# Patient Record
Sex: Female | Born: 1995 | Race: Black or African American | Hispanic: No | Marital: Single | Smoking: Never smoker
Health system: Southern US, Community
[De-identification: ages and names within clinical notes are randomized; demographics above are authoritative.]

## PROBLEM LIST (undated history)

## (undated) DIAGNOSIS — M419 Scoliosis, unspecified: Secondary | ICD-10-CM

## (undated) DIAGNOSIS — J45909 Unspecified asthma, uncomplicated: Secondary | ICD-10-CM

---
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
## 2017-09-25 IMAGING — CR DG SCOLIOSIS EVAL COMPLETE SPINE 1V
1 series · 3 of 3 positions shown · non-contrast
Comparison: Scoliosis survey 09/18/2013 and 09/28/2014.

CLINICAL DATA: History of scoliosis. Mid back pain intermittently
for few weeks.

EXAM:
DG SCOLIOSIS EVAL COMPLETE SPINE 1V

[Series 1001: view not recorded · 0.40mm/px · 3 of 3 slices shown]
[im 1/3]
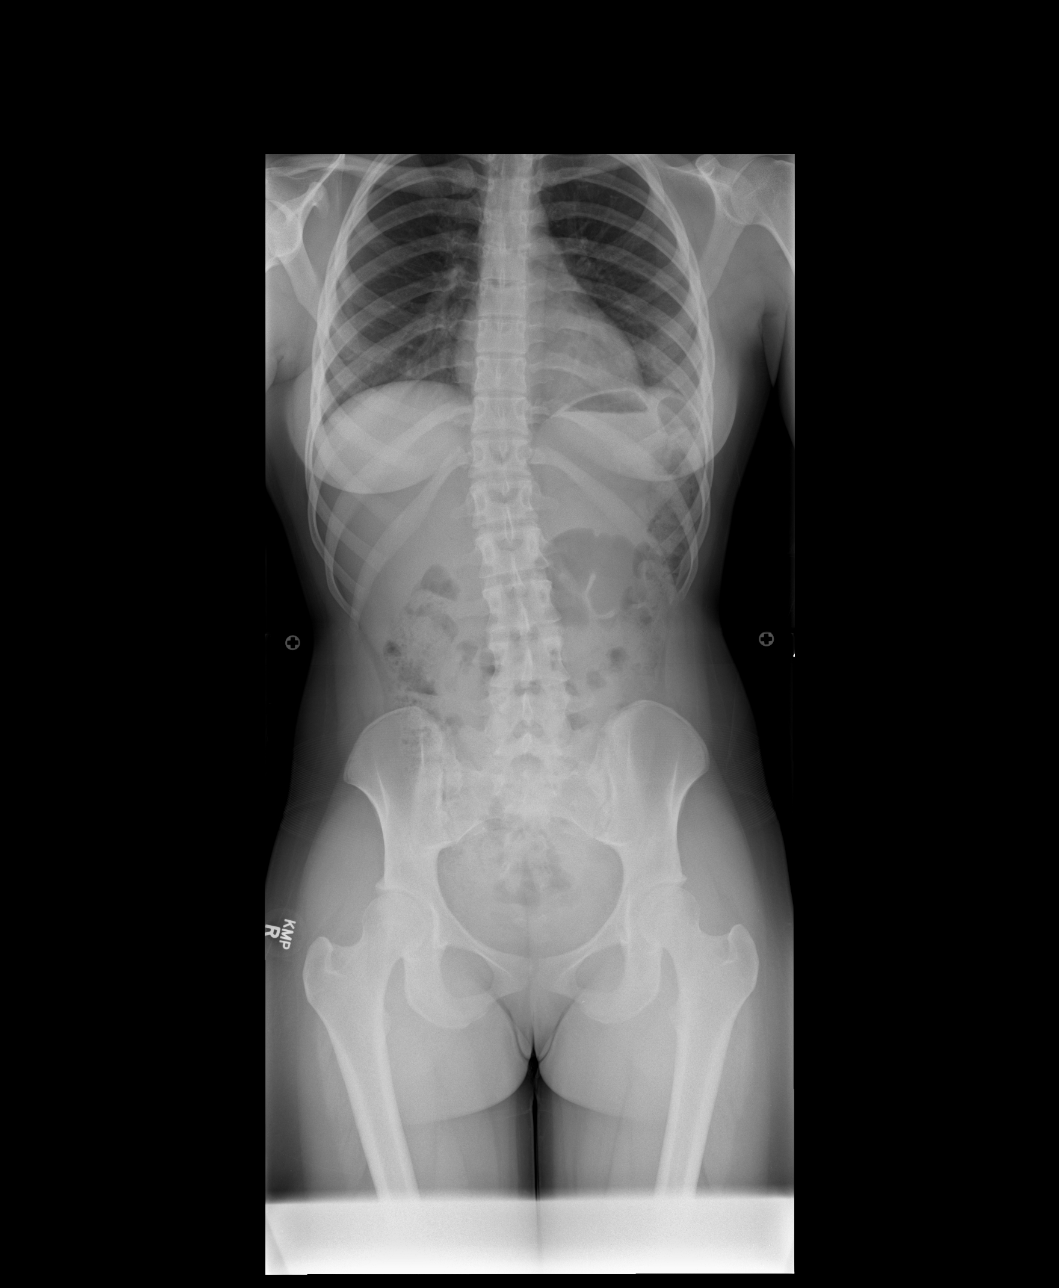
[im 2/3]
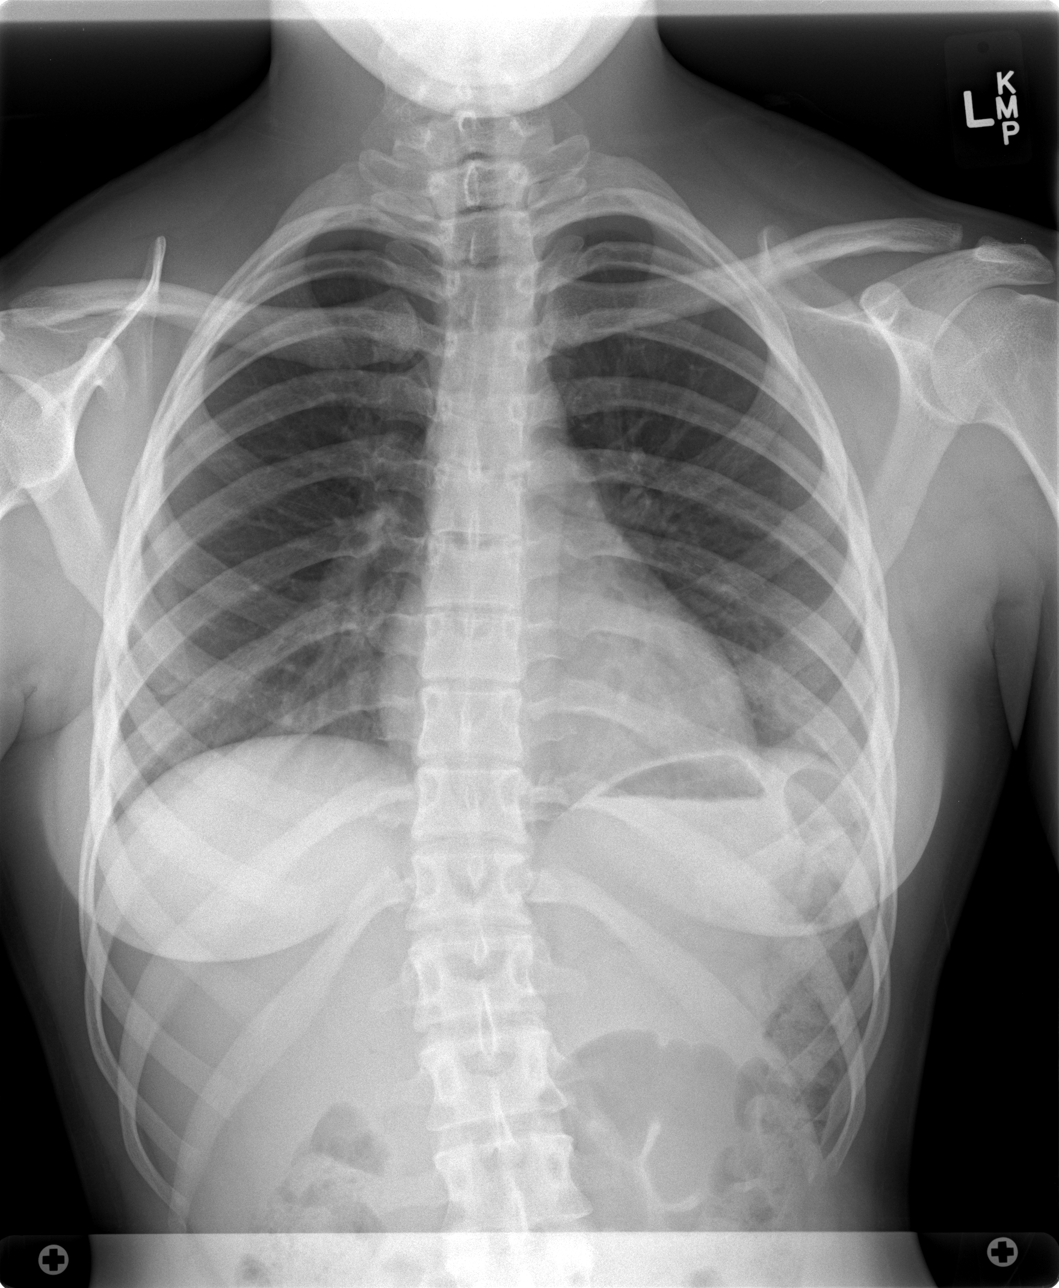
[im 3/3]
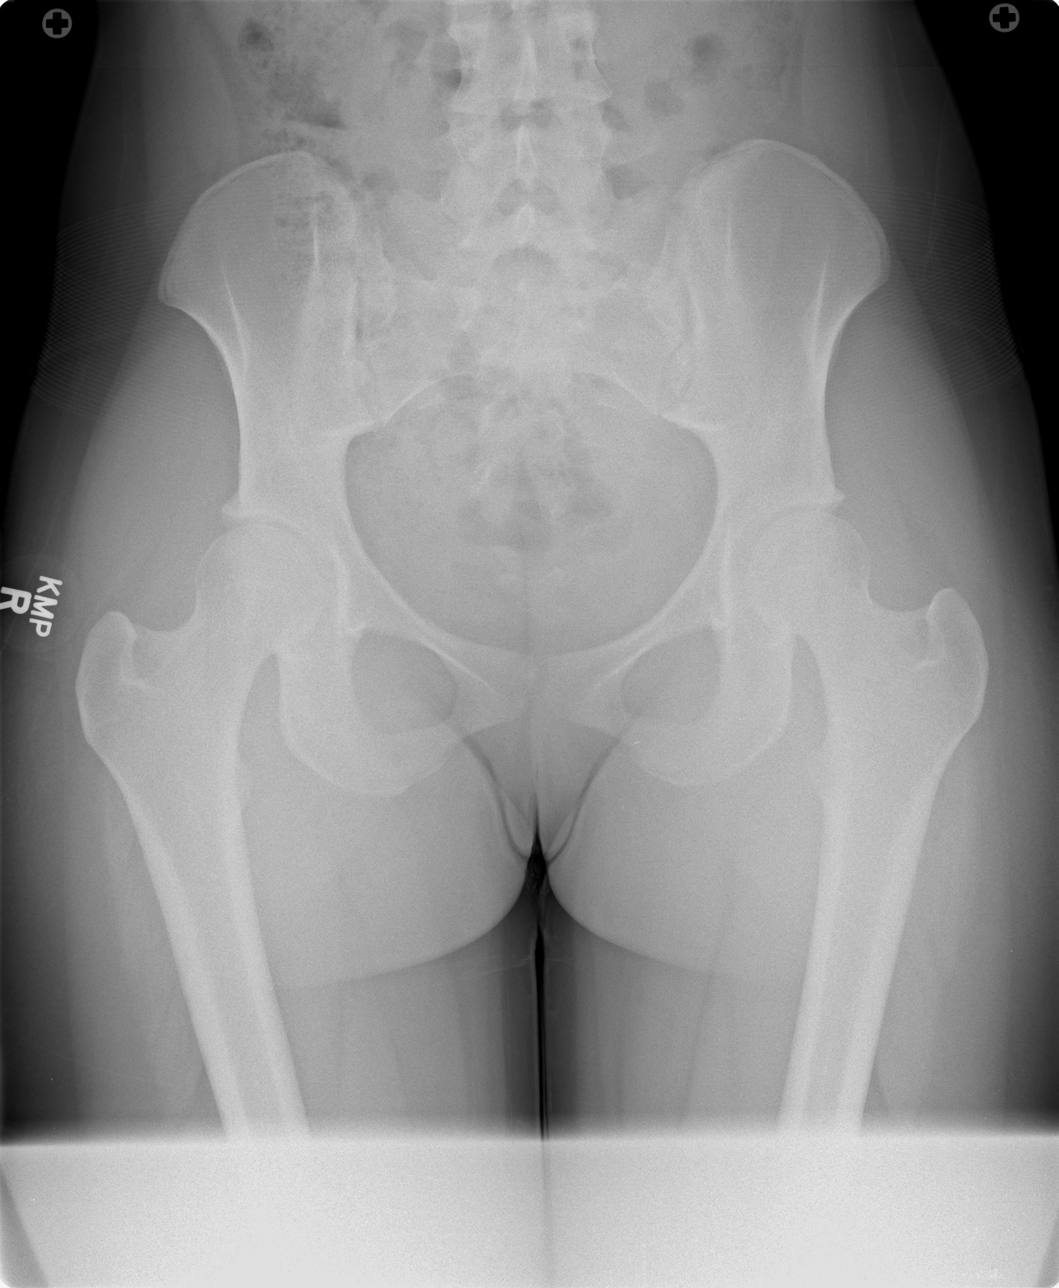

[3 of 3 positions shown; findings below may reference images not displayed]

FINDINGS: Thoracolumbar scoliosis again seen. There is 7 degrees convex right
curvature of the thoracic spine with the apex at approximately T12
and 5 degrees convex left curvature of the lumbar spine with the
apex at approximately at L4-5. The appearance is not markedly
changed compared to the 1777 exam. No segmentation anomaly is
identified. The [REDACTED] rib-bearing vertebral bodies and 5 lumbar
type vertebral bodies.
IMPRESSION: No marked change in mild thoracolumbar scoliosis since 1777. No
acute abnormality.

11 rib-bearing vertebral bodies and 5 lumbar type vertebral bodies
as on prior examinations.

## 2022-05-23 ENCOUNTER — Ambulatory Visit (INDEPENDENT_AMBULATORY_CARE_PROVIDER_SITE_OTHER): Payer: 59 | Admitting: Family Medicine

## 2022-05-23 ENCOUNTER — Encounter: Payer: Self-pay | Admitting: Family Medicine

## 2022-05-23 VITALS — BP 122/76 | HR 87 | Temp 99.2°F | Ht 64.5 in | Wt 151.6 lb

## 2022-05-23 DIAGNOSIS — L308 Other specified dermatitis: Secondary | ICD-10-CM

## 2022-05-23 DIAGNOSIS — J4540 Moderate persistent asthma, uncomplicated: Secondary | ICD-10-CM

## 2022-05-23 DIAGNOSIS — Z23 Encounter for immunization: Secondary | ICD-10-CM

## 2022-05-23 DIAGNOSIS — T300 Burn of unspecified body region, unspecified degree: Secondary | ICD-10-CM | POA: Diagnosis not present

## 2022-05-23 DIAGNOSIS — J302 Other seasonal allergic rhinitis: Secondary | ICD-10-CM | POA: Diagnosis not present
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
## 2022-05-30 ENCOUNTER — Ambulatory Visit: Payer: 59 | Admitting: Family Medicine

## 2022-06-03 ENCOUNTER — Encounter: Payer: Self-pay | Admitting: Family Medicine

## 2022-06-11 ENCOUNTER — Encounter: Payer: 59 | Admitting: Family Medicine

## 2022-06-25 ENCOUNTER — Ambulatory Visit (INDEPENDENT_AMBULATORY_CARE_PROVIDER_SITE_OTHER): Payer: 59 | Admitting: Family Medicine

## 2022-06-25 ENCOUNTER — Encounter: Payer: Self-pay | Admitting: Family Medicine

## 2022-06-25 VITALS — BP 108/68 | HR 98 | Temp 98.8°F | Ht 65.5 in | Wt 153.8 lb

## 2022-06-25 DIAGNOSIS — Z113 Encounter for screening for infections with a predominantly sexual mode of transmission: Secondary | ICD-10-CM

## 2022-06-25 DIAGNOSIS — Z0000 Encounter for general adult medical examination without abnormal findings: Secondary | ICD-10-CM | POA: Diagnosis not present

## 2022-06-25 DIAGNOSIS — Z1159 Encounter for screening for other viral diseases: Secondary | ICD-10-CM

## 2022-06-25 DIAGNOSIS — E559 Vitamin D deficiency, unspecified: Secondary | ICD-10-CM

## 2022-06-25 LAB — LIPID PANEL
Cholesterol: 185 mg/dL (ref 0–200)
HDL: 58.1 mg/dL (ref >39.00)
LDL Cholesterol: 115 mg/dL — ABNORMAL HIGH (ref 0–99)
NonHDL: 126.76
Total CHOL/HDL Ratio: 3
Triglycerides: 59 mg/dL (ref 0.0–149.0)
VLDL: 11.8 mg/dL (ref 0.0–40.0)

## 2022-06-25 LAB — BASIC METABOLIC PANEL
BUN: 8 mg/dL (ref 6–23)
CO2: 25 mEq/L (ref 19–32)
Calcium: 9 mg/dL (ref 8.4–10.5)
Chloride: 106 mEq/L (ref 96–112)
Creatinine, Ser: 0.7 mg/dL (ref 0.40–1.20)
GFR: 120.4 mL/min (ref >60.00)
Glucose, Bld: 87 mg/dL (ref 70–99)
Potassium: 3.9 mEq/L (ref 3.5–5.1)
Sodium: 137 mEq/L (ref 135–145)

## 2022-06-25 LAB — CBC WITH DIFFERENTIAL/PLATELET
Basophils Absolute: 0 10*3/uL (ref 0.0–0.1)
Basophils Relative: 0.7 % (ref 0.0–3.0)
Eosinophils Absolute: 0.3 10*3/uL (ref 0.0–0.7)
Eosinophils Relative: 4.5 % (ref 0.0–5.0)
HCT: 39.1 % (ref 36.0–46.0)
Hemoglobin: 12.9 g/dL (ref 12.0–15.0)
Lymphocytes Relative: 33.2 % (ref 12.0–46.0)
Lymphs Abs: 2.3 10*3/uL (ref 0.7–4.0)
MCHC: 33 g/dL (ref 30.0–36.0)
MCV: 84.4 fl (ref 78.0–100.0)
Monocytes Absolute: 0.7 10*3/uL (ref 0.1–1.0)
Monocytes Relative: 10 % (ref 3.0–12.0)
Neutro Abs: 3.6 10*3/uL (ref 1.4–7.7)
Neutrophils Relative %: 51.6 % (ref 43.0–77.0)
Platelets: 403 10*3/uL — ABNORMAL HIGH (ref 150.0–400.0)
RBC: 4.64 Mil/uL (ref 3.87–5.11)
RDW: 13.6 % (ref 11.5–15.5)
WBC: 6.9 10*3/uL (ref 4.0–10.5)

## 2022-06-25 LAB — VITAMIN D 25 HYDROXY (VIT D DEFICIENCY, FRACTURES): VITD: 43.69 ng/mL (ref 30.00–100.00)

## 2022-06-25 LAB — HEMOGLOBIN A1C: Hgb A1c MFr Bld: 6 % (ref 4.6–6.5)

## 2022-06-25 NOTE — Patient Instructions (Signed)
As needed
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
## 2022-06-25 NOTE — Addendum Note (Signed)
Addended by: Marian Sorrow D on: 06/25/2022 10:17 AM   Modules accepted: Orders

## 2022-06-26 LAB — C. TRACHOMATIS/N. GONORRHOEAE RNA
C. trachomatis RNA, TMA: NOT DETECTED
N. gonorrhoeae RNA, TMA: NOT DETECTED

## 2022-09-05 ENCOUNTER — Telehealth: Payer: Self-pay | Admitting: Family Medicine

## 2022-09-05 NOTE — Telephone Encounter (Signed)
Pt needs a refill on Flovent sent to her pharmacy.  There was a mix up at the pharmacy and now the patient is out.    Pharmacy- [facility]

## 2022-09-05 NOTE — Telephone Encounter (Signed)
Pt stated that Dr. Volanda Napoleon is her new provider and would like for her to take over filling the flovent. [place] for refill?

## 2022-09-06 ENCOUNTER — Telehealth: Payer: Self-pay | Admitting: Family Medicine

## 2022-09-06 MED ORDER — FLUTICASONE PROPIONATE HFA 110 MCG/ACT IN AERO: 2.0000 | INHALATION_SPRAY | Freq: Two times a day (BID) | RESPIRATORY_TRACT | 11 refills | Status: DC

## 2022-09-06 NOTE — Telephone Encounter (Signed)
Rx refilled.

## 2022-09-06 NOTE — Telephone Encounter (Signed)
Ok to refill flovent with 11 refills.

## 2022-09-18 ENCOUNTER — Other Ambulatory Visit: Payer: Self-pay | Admitting: Family Medicine

## 2022-09-21 NOTE — Telephone Encounter (Signed)
Patient calling states the Qvar is covered under her insurance and please write for this.

## 2022-09-26 NOTE — Telephone Encounter (Signed)
Pt said qvar is covered

## 2022-09-28 ENCOUNTER — Other Ambulatory Visit: Payer: Self-pay | Admitting: Family Medicine

## 2022-09-28 MED ORDER — QVAR REDIHALER 40 MCG/ACT IN AERB: 1.0000 | INHALATION_SPRAY | Freq: Two times a day (BID) | RESPIRATORY_TRACT | 11 refills | Status: DC

## 2022-09-28 NOTE — Telephone Encounter (Signed)
Qvar sent to pharmacy 

## 2022-12-04 ENCOUNTER — Telehealth: Payer: Self-pay | Admitting: Family Medicine

## 2022-12-04 ENCOUNTER — Other Ambulatory Visit: Payer: Self-pay | Admitting: Family Medicine

## 2022-12-04 NOTE — Telephone Encounter (Signed)
Prescription Request  12/04/2022  LOV: 06/25/2022  What is the name of the medication or equipment? montelukast (SINGULAIR) 10 MG tablet . This med was prescribed by her old pcp  Have you contacted your pharmacy to request a refill? No   Which pharmacy would you like this sent to?  [id number] - [place], [place] - [address] AT [facility] [address] [place] [address] Phone: [phone] Fax: [phone]    Patient notified that their request is being sent to the clinical staff for review and that they should receive a response within 2 business days.   Please advise at Mobile [phone] (mobile)

## 2022-12-05 NOTE — Telephone Encounter (Signed)
Med refilled.

## 2023-03-07 ENCOUNTER — Encounter (INDEPENDENT_AMBULATORY_CARE_PROVIDER_SITE_OTHER): Payer: Self-pay

## 2023-03-27 ENCOUNTER — Encounter: Payer: Self-pay | Admitting: Family Medicine

## 2023-03-27 ENCOUNTER — Ambulatory Visit (INDEPENDENT_AMBULATORY_CARE_PROVIDER_SITE_OTHER): Payer: 59 | Admitting: Family Medicine

## 2023-03-27 VITALS — BP 98/68 | HR 78 | Temp 98.7°F | Ht 65.5 in | Wt 151.0 lb

## 2023-03-27 DIAGNOSIS — Z23 Encounter for immunization: Secondary | ICD-10-CM

## 2023-03-27 DIAGNOSIS — Z0000 Encounter for general adult medical examination without abnormal findings: Secondary | ICD-10-CM

## 2023-03-27 DIAGNOSIS — Z1159 Encounter for screening for other viral diseases: Secondary | ICD-10-CM | POA: Diagnosis not present

## 2023-03-27 DIAGNOSIS — E7841 Elevated Lipoprotein(a): Secondary | ICD-10-CM | POA: Diagnosis not present

## 2023-03-27 DIAGNOSIS — R7303 Prediabetes: Secondary | ICD-10-CM

## 2023-03-27 DIAGNOSIS — J302 Other seasonal allergic rhinitis: Secondary | ICD-10-CM

## 2023-03-27 LAB — COMPREHENSIVE METABOLIC PANEL
ALT: 14 U/L (ref 0–35)
AST: 31 U/L (ref 0–37)
Albumin: 3.7 g/dL (ref 3.5–5.2)
Alkaline Phosphatase: 40 U/L (ref 39–117)
BUN: 9 mg/dL (ref 6–23)
CO2: 27 meq/L (ref 19–32)
Calcium: 9.3 mg/dL (ref 8.4–10.5)
Chloride: 105 meq/L (ref 96–112)
Creatinine, Ser: 0.72 mg/dL (ref 0.40–1.20)
GFR: 115.78 mL/min (ref >60.00)
Glucose, Bld: 88 mg/dL (ref 70–99)
Potassium: 4.3 meq/L (ref 3.5–5.1)
Sodium: 138 meq/L (ref 135–145)
Total Bilirubin: 0.3 mg/dL (ref 0.2–1.2)
Total Protein: 6.8 g/dL (ref 6.0–8.3)

## 2023-03-27 LAB — CBC WITH DIFFERENTIAL/PLATELET
Basophils Absolute: 0.1 10*3/uL (ref 0.0–0.1)
Basophils Relative: 0.9 % (ref 0.0–3.0)
Eosinophils Absolute: 0.5 10*3/uL (ref 0.0–0.7)
Eosinophils Relative: 5.4 % — ABNORMAL HIGH (ref 0.0–5.0)
HCT: 37.4 % (ref 36.0–46.0)
Hemoglobin: 11.9 g/dL — ABNORMAL LOW (ref 12.0–15.0)
Lymphocytes Relative: 29.3 % (ref 12.0–46.0)
Lymphs Abs: 2.5 10*3/uL (ref 0.7–4.0)
MCHC: 31.8 g/dL (ref 30.0–36.0)
MCV: 84.9 fl (ref 78.0–100.0)
Monocytes Absolute: 0.6 10*3/uL (ref 0.1–1.0)
Monocytes Relative: 7.2 % (ref 3.0–12.0)
Neutro Abs: 4.9 10*3/uL (ref 1.4–7.7)
Neutrophils Relative %: 57.2 % (ref 43.0–77.0)
Platelets: 462 10*3/uL — ABNORMAL HIGH (ref 150.0–400.0)
RBC: 4.41 Mil/uL (ref 3.87–5.11)
RDW: 13.8 % (ref 11.5–15.5)
WBC: 8.5 10*3/uL (ref 4.0–10.5)

## 2023-03-27 LAB — LIPID PANEL
Cholesterol: 167 mg/dL (ref 0–200)
HDL: 52.5 mg/dL (ref >39.00)
LDL Cholesterol: 95 mg/dL (ref 0–99)
NonHDL: 114.75
Total CHOL/HDL Ratio: 3
Triglycerides: 98 mg/dL (ref 0.0–149.0)
VLDL: 19.6 mg/dL (ref 0.0–40.0)

## 2023-03-27 LAB — TSH: TSH: 1.32 u[IU]/mL (ref 0.35–5.50)

## 2023-03-27 LAB — HEMOGLOBIN A1C: Hgb A1c MFr Bld: 5.9 % (ref 4.6–6.5)

## 2023-03-27 MED ORDER — AZELASTINE HCL 137 MCG/SPRAY NA SOLN
1.0000 | Freq: Every day | NASAL | 5 refills | Status: DC | PRN
Start: 2023-03-27 — End: 2023-12-12

## 2023-03-27 NOTE — Progress Notes (Addendum)
Established Patient Office Visit   Subjective  Patient ID: Trina Ao, female    DOB: 1996-07-04  Age: 27 y.o. MRN: [id number]  Chief Complaint  Patient presents with   Annual Exam    Pt is a 27 yo female seen for CPE.  Pt doing well.  Staying busy with school and work.  Patient states she should be finished with school in December.  Patient wanted to get a physical before her parents insurance runs out when she turns 27.  Patient had Tdap on 01/30/2016 at pediatrician office prior to shadowing.  Pap done 07/2022.  Requesting refill on azelastine.  Patient notes history of eczema.  States in the past went on vacation and developed blisters on hands.  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  Patient inquires if there is anything she can do to prevent this.    Past Medical History:  Diagnosis Date   Asthma    Scoliosis    History reviewed. No pertinent surgical history. Social History   Tobacco Use   Smoking status: Never   History reviewed. No pertinent family history. Allergies  Allergen Reactions   Oseltamivir Rash and Other (See Comments)      ROS Negative unless stated above    Objective:     BP 98/68 (BP Location: Left Arm, Patient Position: Sitting, Cuff Size: Normal)   Pulse 78   Temp 98.7 F (37.1 C) (Oral)   Ht 5' 5.5" (1.664 m)   Wt 151 lb (68.5 kg)   LMP 03/07/2023 (Exact Date)   SpO2 99%   BMI 24.75 kg/m  BP Readings from Last 3 Encounters:  03/27/23 98/68  06/25/22 108/68  05/23/22 122/76   Wt Readings from Last 3 Encounters:  03/27/23 151 lb (68.5 kg)  06/25/22 153 lb 12.8 oz (69.8 kg)  05/23/22 151 lb 9.6 oz (68.8 kg)      Physical Exam Constitutional:      Appearance: Normal appearance.  HENT:     Head: Normocephalic and atraumatic.     Right Ear: Tympanic membrane, ear canal and external ear normal.     Left Ear: Tympanic membrane, ear canal and external ear normal.     Nose: Nose normal.     Mouth/Throat:     Mouth:  Mucous membranes are moist.     Pharynx: No oropharyngeal exudate or posterior oropharyngeal erythema.  Eyes:     General: No scleral icterus.    Extraocular Movements: Extraocular movements intact.     Conjunctiva/sclera: Conjunctivae normal.     Pupils: Pupils are equal, round, and reactive to light.  Neck:     Thyroid: No thyromegaly.  Cardiovascular:     Rate and Rhythm: Normal rate and regular rhythm.     Pulses: Normal pulses.     Heart sounds: Normal heart sounds. No murmur heard.    No friction rub.  Pulmonary:     Effort: Pulmonary effort is normal.     Breath sounds: Normal breath sounds. No wheezing, rhonchi or rales.  Abdominal:     General: Bowel sounds are normal.     Palpations: Abdomen is soft.     Tenderness: There is no abdominal tenderness.  Musculoskeletal:        General: No deformity. Normal range of motion.  Lymphadenopathy:     Cervical: No cervical adenopathy.  Skin:    General: Skin is warm and dry.     Findings: No lesion.     Comments:  Dry appearing patches of skin on right lateral neck.  Neurological:     General: No focal deficit present.     Mental Status: She is alert and oriented to person, place, and time.  Psychiatric:        Mood and Affect: Mood normal.        Thought Content: Thought content normal.      No results found for any visits on 03/27/23.    Assessment & Plan:  Well adult exam -     CBC with Differential/Platelet; Future -     Comprehensive metabolic panel; Future -     Lipid panel; Future -     TSH; Future -     Hemoglobin A1c; Future  Need for influenza vaccination -     Flu vaccine trivalent PF, 6mos and older(Flulaval,Afluria,Fluarix,Fluzone)  Prediabetes -Hemoglobin A1c 6.0% on 06/25/2022 -Lifestyle modifications -     Hemoglobin A1c; Future  Elevated lipoprotein(a) -Total cholesterol 185, HDL 58.1, LDL 115, triglycerides 59 on 06/25/2022. -Lifestyle modifications -     Comprehensive metabolic panel;  Future -     Lipid panel; Future  Seasonal allergies -Azelastine nasal spray  Age-appropriate health screenings discussed.  Labs obtained.  Pap up-to-date as done 07/2022.  Immunizations reviewed.  Influenza vaccine given this visit.  Next CPE in 1 year.  Return if symptoms worsen or fail to improve.   Deeann Saint, MD

## 2023-03-27 NOTE — Addendum Note (Signed)
Addended by: Abbe Amsterdam R on: 03/27/2023 09:16 AM   Modules accepted: Orders

## 2023-03-28 LAB — HEPATITIS C ANTIBODY: Hepatitis C Ab: NONREACTIVE

## 2023-06-27 ENCOUNTER — Encounter: Payer: 59 | Admitting: Family Medicine

## 2023-09-03 LAB — HM PAP SMEAR: Chlamydia, Swab/Urine, PCR: POSITIVE

## 2023-10-02 ENCOUNTER — Other Ambulatory Visit: Payer: Self-pay | Admitting: Family Medicine

## 2023-10-02 NOTE — Telephone Encounter (Signed)
 Copied from CRM [id number]. Topic: Clinical - Medication Refill >> Oct 02, 2023  1:02 PM [place] J wrote: Most Recent Primary Care Visit:  Provider: Abbe Amsterdam R  Department: [facility]  Visit Type: PHYSICAL  Date: 03/27/2023  Medication: triamcinolone ointment (KENALOG) 0.1 %  Has the patient contacted their pharmacy? Yes (Agent: If no, request that the patient contact the pharmacy for the refill. If patient does not wish to contact the pharmacy document the reason why and proceed with request.) (Agent: If yes, when and what did the pharmacy advise?) Pharmacy stated that medication was denied.  Is this the correct pharmacy for this prescription? Yes If no, delete pharmacy and type the correct one.  This is the patient's preferred pharmacy:  [id number] - [place], [place] - [address] AT [facility] [address] [place] [address] Phone: [phone] Fax: [phone]   Has the prescription been filled recently? No  Is the patient out of the medication? Yes  Has the patient been seen for an appointment in the last year OR does the patient have an upcoming appointment? Yes  Can we respond through MyChart? Yes  Agent: Please be advised that Rx refills may take up to 3 business days. We ask that you follow-up with your pharmacy.

## 2023-11-21 LAB — MOLECULAR PATHOLOGY: Chlamydia, Swab/Urine, PCR: NEGATIVE

## 2023-12-11 ENCOUNTER — Other Ambulatory Visit: Payer: Self-pay | Admitting: Family Medicine

## 2023-12-11 DIAGNOSIS — J302 Other seasonal allergic rhinitis: Secondary | ICD-10-CM

## 2024-03-03 ENCOUNTER — Telehealth: Payer: Self-pay

## 2024-03-03 NOTE — Telephone Encounter (Signed)
 [facility] They state that patient's insurance will cover Asmanex HFA or Pulmicort Flexhaler in place of QVAR.

## 2024-03-03 NOTE — Telephone Encounter (Signed)
 Copied from CRM [id number]. Topic: Clinical - Prescription Issue >> Mar 02, 2024  1:27 PM Franky GRADE wrote: Reason for CRM: Patient was informed by her pharmacy that her insurance does not cover QVAR  REDIHALER 40 MCG/ACT inhaler [[id number]], and she would like to know if there is an alternative inhaler that can be prescribed.

## 2024-03-05 ENCOUNTER — Other Ambulatory Visit: Payer: Self-pay | Admitting: Family Medicine

## 2024-03-05 DIAGNOSIS — J4530 Mild persistent asthma, uncomplicated: Secondary | ICD-10-CM

## 2024-03-05 MED ORDER — ASMANEX HFA 100 MCG/ACT IN AERO
2.0000 | INHALATION_SPRAY | Freq: Two times a day (BID) | RESPIRATORY_TRACT | 5 refills | Status: DC
Start: 2024-03-05 — End: 2024-03-12

## 2024-03-05 NOTE — Telephone Encounter (Signed)
 Asmanex  sent to pharmacy.

## 2024-03-09 ENCOUNTER — Other Ambulatory Visit: Payer: Self-pay | Admitting: Family Medicine

## 2024-03-09 DIAGNOSIS — J4530 Mild persistent asthma, uncomplicated: Secondary | ICD-10-CM

## 2024-03-12 MED ORDER — BUDESONIDE 90 MCG/ACT IN AEPB: 2.0000 | INHALATION_SPRAY | Freq: Two times a day (BID) | RESPIRATORY_TRACT | 5 refills | Status: AC

## 2024-03-12 NOTE — Addendum Note (Signed)
 Addended by: MERCER CLOTILDA SAUNDERS on: 03/12/2024 09:37 AM   Modules accepted: Orders

## 2024-03-27 ENCOUNTER — Ambulatory Visit (INDEPENDENT_AMBULATORY_CARE_PROVIDER_SITE_OTHER): Payer: 59 | Admitting: Family Medicine

## 2024-03-27 ENCOUNTER — Encounter: Payer: Self-pay | Admitting: Family Medicine

## 2024-03-27 VITALS — BP 110/80 | HR 81 | Temp 98.8°F | Ht 65.75 in | Wt 153.2 lb

## 2024-03-27 DIAGNOSIS — Z0000 Encounter for general adult medical examination without abnormal findings: Secondary | ICD-10-CM

## 2024-03-27 DIAGNOSIS — J4540 Moderate persistent asthma, uncomplicated: Secondary | ICD-10-CM

## 2024-03-27 DIAGNOSIS — Z23 Encounter for immunization: Secondary | ICD-10-CM

## 2024-03-27 DIAGNOSIS — L309 Dermatitis, unspecified: Secondary | ICD-10-CM

## 2024-03-27 MED ORDER — ALBUTEROL SULFATE HFA 108 (90 BASE) MCG/ACT IN AERS
2.0000 | INHALATION_SPRAY | Freq: Four times a day (QID) | RESPIRATORY_TRACT | 6 refills | Status: AC | PRN
Start: 2024-03-27 — End: ?

## 2024-03-27 MED ORDER — TRIAMCINOLONE ACETONIDE 0.1 % EX OINT: 1.0000 | TOPICAL_OINTMENT | Freq: Two times a day (BID) | CUTANEOUS | 2 refills | Status: AC | PRN

## 2024-03-27 NOTE — Progress Notes (Signed)
 Established Patient Office Visit   Subjective  Patient ID: Lauren May, female    DOB: 1996/01/17  Age: 28 y.o. MRN: [id number]  Chief Complaint  Patient presents with   Annual Exam    Pt is a 28 yo female seen for CPE.  Pt doing well.  Requesting refills on albuterol  inhaler and triamcinolone  ointment for eczema. Insurance no longer covering Qvar .  Was given a redihaler but unsure on how to use.  Pt had an episode of L elbow pain x 3 days.  Was difficult to straigthen arm.  Did not recall any increased activity. Took ibuprofen and used ice.  Symptoms now resolved.  May have occurred due to working a part-time job at [employer]. Pt mentions occasional tenderness in R breast near nipple.  Denies skin changes, nipple d/c, or masses.  Having regular menses.    Patient Active Problem List   Diagnosis Date Noted   Asthma 12/17/2015   Past Medical History:  Diagnosis Date   Asthma    Scoliosis    No past surgical history on file. Social History   Tobacco Use   Smoking status: Never   No family history on file. Allergies  Allergen Reactions   Oseltamivir Rash and Other (See Comments)    ROS Negative unless stated above    Objective:     BP 110/80 (BP Location: Other (Comment), Patient Position: Sitting, Cuff Size: Normal)   Pulse 81   Temp 98.8 F (37.1 C) (Oral)   Ht 5' 5.75 (1.67 m)   Wt 153 lb 3.2 oz (69.5 kg)   LMP 03/05/2024 (Approximate)   SpO2 98%   BMI 24.92 kg/m  BP Readings from Last 3 Encounters:  03/27/24 110/80  03/27/23 98/68  06/25/22 108/68   Wt Readings from Last 3 Encounters:  03/27/24 153 lb 3.2 oz (69.5 kg)  03/27/23 151 lb (68.5 kg)  06/25/22 153 lb 12.8 oz (69.8 kg)      Physical Exam Constitutional:      Appearance: Normal appearance.  HENT:     Head: Normocephalic and atraumatic.     Right Ear: Tympanic membrane, ear canal and external ear normal.     Left Ear: Tympanic membrane, ear canal and external ear normal.      Nose: Nose normal.     Mouth/Throat:     Mouth: Mucous membranes are moist.     Pharynx: No oropharyngeal exudate or posterior oropharyngeal erythema.  Eyes:     General: No scleral icterus.    Extraocular Movements: Extraocular movements intact.     Conjunctiva/sclera: Conjunctivae normal.     Pupils: Pupils are equal, round, and reactive to light.  Neck:     Thyroid: No thyromegaly.     Vascular: No carotid bruit.  Cardiovascular:     Rate and Rhythm: Normal rate and regular rhythm.     Pulses: Normal pulses.     Heart sounds: Normal heart sounds. No murmur heard.    No friction rub.  Pulmonary:     Effort: Pulmonary effort is normal.     Breath sounds: Normal breath sounds. No wheezing, rhonchi or rales.  Abdominal:     General: Bowel sounds are normal.     Palpations: Abdomen is soft.     Tenderness: There is no abdominal tenderness.  Musculoskeletal:        General: No deformity. Normal range of motion.     Comments: No TTP, edema, erythema of left elbow  Lymphadenopathy:  Cervical: No cervical adenopathy.  Skin:    General: Skin is warm and dry.     Findings: No lesion.     Comments: Mild eczematous plaques on dorsum of bilateral hands between 1st and 2nd digits.  Neurological:     General: No focal deficit present.     Mental Status: She is alert and oriented to person, place, and time.  Psychiatric:        Mood and Affect: Mood normal.        Thought Content: Thought content normal.        03/27/2024    8:11 AM 03/27/2023    8:22 AM 06/25/2022    8:19 AM  Depression screen PHQ 2/9  Decreased Interest 0 0 0  Down, Depressed, Hopeless 0 0 1  PHQ - 2 Score 0 0 1  Altered sleeping  0 0  Tired, decreased energy  1 1  Change in appetite  0 0  Feeling bad or failure about yourself   0 0  Trouble concentrating  0 0  Moving slowly or fidgety/restless  0 0  Suicidal thoughts  0 0  PHQ-9 Score  1 2  Difficult doing work/chores  Not difficult at all Not difficult  at all      03/27/2023    8:22 AM  GAD 7 : Generalized Anxiety Score  Nervous, Anxious, on Edge 2  Control/stop worrying 1  Worry too much - different things 1  Trouble relaxing 1  Restless 0  Easily annoyed or irritable 0  Afraid - awful might happen 0  Total GAD 7 Score 5  Anxiety Difficulty Not difficult at all     No results found for any visits on 03/27/24.    Assessment & Plan:   Well adult exam  Influenza vaccine needed -     Flu vaccine trivalent PF, 6mos and older(Flulaval,Afluria,Fluarix,Fluzone)  Eczema, unspecified type -     Triamcinolone  Acetonide; Apply 1 Application topically 2 (two) times daily as needed.  Dispense: 80 g; Refill: 2  Moderate persistent asthma without complication -     Albuterol  Sulfate HFA; Inhale 2 puffs into the lungs every 6 (six) hours as needed for wheezing or shortness of breath.  Dispense: 1 each; Refill: 6  Age-appropriate health screenings discussed.  Patient declines labs at this time.  Immunizations reviewed.  Influenza vaccine given this visit.  Pap with OB/GYN.  Monthly self breast exams.  Refill on triamcinolone  ointment 0.01% and albuterol  inhaler sent to pharmacy.  Reviewed use of budesonide  powder inhaler.    Return if symptoms worsen or fail to improve.   Clotilda JONELLE Single, MD

## 2025-03-31 ENCOUNTER — Encounter: Admitting: Family Medicine
